# Patient Record
Sex: Male | Born: 1960
Health system: Southern US, Community
[De-identification: ages and names within clinical notes are randomized; demographics above are authoritative.]

## PROBLEM LIST (undated history)

## (undated) DIAGNOSIS — F32A Depression, unspecified: Secondary | ICD-10-CM

## (undated) DIAGNOSIS — I10 Essential (primary) hypertension: Secondary | ICD-10-CM

## (undated) DIAGNOSIS — E669 Obesity, unspecified: Secondary | ICD-10-CM

## (undated) DIAGNOSIS — Z8489 Family history of other specified conditions: Secondary | ICD-10-CM

## (undated) DIAGNOSIS — I2510 Atherosclerotic heart disease of native coronary artery without angina pectoris: Secondary | ICD-10-CM

## (undated) DIAGNOSIS — F329 Major depressive disorder, single episode, unspecified: Secondary | ICD-10-CM

## (undated) HISTORY — DX: Atherosclerotic heart disease of native coronary artery without angina pectoris: I25.10

## (undated) HISTORY — DX: Obesity, unspecified: E66.9

## (undated) HISTORY — DX: Essential (primary) hypertension: I10

## (undated) HISTORY — PX: NASAL SEPTUM SURGERY: SHX37

## (undated) HISTORY — PX: FRACTURE SURGERY: SHX138

---

## 1981-06-23 HISTORY — PX: INGUINAL HERNIA REPAIR: SUR1180

## 1995-06-24 HISTORY — PX: NASAL FRACTURE SURGERY: SHX718

## 2002-12-22 ENCOUNTER — Observation Stay (HOSPITAL_COMMUNITY): Admission: EM | Admit: 2002-12-22 | Discharge: 2002-12-23 | Payer: Self-pay | Admitting: Emergency Medicine

## 2002-12-22 ENCOUNTER — Encounter: Payer: Self-pay | Admitting: Emergency Medicine

## 2006-09-24 ENCOUNTER — Ambulatory Visit (HOSPITAL_COMMUNITY): Admission: RE | Admit: 2006-09-24 | Discharge: 2006-09-24 | Payer: Self-pay | Admitting: Family Medicine

## 2013-05-06 ENCOUNTER — Emergency Department (HOSPITAL_COMMUNITY): Payer: BC Managed Care – PPO

## 2013-05-06 ENCOUNTER — Emergency Department (HOSPITAL_COMMUNITY)
Admission: EM | Admit: 2013-05-06 | Discharge: 2013-05-06 | Disposition: A | Discharge status: Home / Self Care | Payer: BC Managed Care – PPO | Attending: Emergency Medicine | Admitting: Emergency Medicine

## 2013-05-06 ENCOUNTER — Encounter (HOSPITAL_COMMUNITY): Payer: Self-pay | Admitting: Emergency Medicine

## 2013-05-06 DIAGNOSIS — I10 Essential (primary) hypertension: Secondary | ICD-10-CM | POA: Insufficient documentation

## 2013-05-06 DIAGNOSIS — F430 Acute stress reaction: Secondary | ICD-10-CM

## 2013-05-06 DIAGNOSIS — Z79899 Other long term (current) drug therapy: Secondary | ICD-10-CM | POA: Insufficient documentation

## 2013-05-06 LAB — RAPID URINE DRUG SCREEN, HOSP PERFORMED
Amphetamines: NOT DETECTED
Barbiturates: NOT DETECTED
Benzodiazepines: NOT DETECTED
Cocaine: NOT DETECTED
Opiates: NOT DETECTED
Tetrahydrocannabinol: NOT DETECTED

## 2013-05-06 LAB — CBC
HCT: 44.3 % (ref 39.0–52.0)
Hemoglobin: 15.6 g/dL (ref 13.0–17.0)
MCH: 29.9 pg (ref 26.0–34.0)
MCHC: 35.2 g/dL (ref 30.0–36.0)
MCV: 84.9 fL (ref 78.0–100.0)
Platelets: 241 10*3/uL (ref 150–400)
RBC: 5.22 MIL/uL (ref 4.22–5.81)
RDW: 12.4 % (ref 11.5–15.5)
WBC: 7.5 10*3/uL (ref 4.0–10.5)

## 2013-05-06 LAB — BASIC METABOLIC PANEL
BUN: 23 mg/dL (ref 6–23)
CO2: 24 mEq/L (ref 19–32)
Calcium: 9.3 mg/dL (ref 8.4–10.5)
Chloride: 100 mEq/L (ref 96–112)
Creatinine, Ser: 1.23 mg/dL (ref 0.50–1.35)
GFR calc Af Amer: 76 mL/min — ABNORMAL LOW (ref >90)
GFR calc non Af Amer: 66 mL/min — ABNORMAL LOW (ref >90)
Glucose, Bld: 102 mg/dL — ABNORMAL HIGH (ref 70–99)
Potassium: 3.8 mEq/L (ref 3.5–5.1)
Sodium: 137 mEq/L (ref 135–145)

## 2013-05-06 LAB — ETHANOL: Alcohol, Ethyl (B): <11 mg/dL (ref 0–11)

## 2013-05-06 MED ORDER — LORAZEPAM 1 MG PO TABS
1.0000 mg | ORAL_TABLET | Freq: Once | ORAL | Status: AC
  Administered 2013-05-06: 1 mg via ORAL
  Filled 2013-05-06: qty 1

## 2013-05-06 MED ORDER — KETOROLAC TROMETHAMINE 15 MG/ML IJ SOLN
30.0000 mg | Freq: Once | INTRAMUSCULAR | Status: AC
  Administered 2013-05-06: 15 mg via INTRAVENOUS
  Filled 2013-05-06: qty 2

## 2013-05-06 NOTE — ED Notes (Signed)
Received pt from home via EMS with c/o arguing with wife at 0300. At 0400 pt became non verbal. Pt reported to EMS that he has insomnia x 2 days. Pt able to communicate by writing. Pt moves all extremities. Grips equal B/L, no facial droop noted, no arm drift.

## 2013-05-06 NOTE — ED Notes (Signed)
Patient transported to CT 

## 2013-05-06 NOTE — ED Notes (Signed)
Pt returned from CT scan, tolerated well.  Pt c/o generalized headache, denies any blurred vision - only being sleepy.  Pt is GCS 15, able to speak in full sentences, conversing and laughing with wife and family at bedside.

## 2013-05-06 NOTE — ED Provider Notes (Signed)
Medical screening examination/treatment/procedure(s) were performed by non-physician practitioner and as supervising physician I was immediately available for consultation/collaboration.  EKG Interpretation     Ventricular Rate:  74 PR Interval:  188 QRS Duration: 83 QT Interval:  376 QTC Calculation: 417 R Axis:   32 Text Interpretation:  Sinus rhythm No clear ischemic findings              Darlys Gales, MD 05/06/13 2212

## 2013-05-06 NOTE — ED Notes (Signed)
Peanut butter and crackers, Coke given.

## 2013-05-06 NOTE — ED Provider Notes (Signed)
CSN: [id number]     Arrival date & time 05/06/13  4098 History   First MD Initiated Contact with Patient 05/06/13 [id number]     Chief Complaint  Patient presents with  . Aphasia   (Consider location/radiation/quality/duration/timing/severity/associated sxs/prior Treatment) HPI Patient presents emergency department with inability to speak.  Patient, states, that he is under a lot of stress and that his brain is not working right.  Patient, states has not been sleeping well.  He been under a lot of stress, and that's what he feels is causing his problem.  Patient denies visual changes, numbness, weakness, dizziness,  blurred vision, fever, chest pain, shortness of breath, abdominal pain, nausea, vomiting, or diarrhea.  The patient, states, that this started several days, ago, but has gotten worse.  The patient, states, that he is stressed out. Past Medical History  Diagnosis Date  . Hypertension    Past Surgical History  Procedure Laterality Date  . Nose surgery     No family history on file. History  Substance Use Topics  . Smoking status: Never Smoker   . Smokeless tobacco: Not on file  . Alcohol Use: No    Review of Systems All other systems negative except as documented in the HPI. All pertinent positives and negatives as reviewed in the HPI. Allergies  Review of patient's allergies indicates no known allergies.  Home Medications   Current Outpatient Rx  Name  Route  Sig  Dispense  Refill  . amLODipine (NORVASC) 5 MG tablet   Oral   Take 5 mg by mouth daily.         Marland Kitchen losartan-hydrochlorothiazide (HYZAAR) 100-25 MG per tablet   Oral   Take 1 tablet by mouth daily.          BP 130/84  Pulse 64  Temp(Src) 98.2 F (36.8 C) (Oral)  Resp 16  Ht 6\' 3"  (1.905 m)  Wt 270 lb (122.471 kg)  BMI 33.75 kg/m2  SpO2 96% Physical Exam  Nursing note and vitals reviewed. Constitutional: He is oriented to person, place, and time. He appears well-developed and well-nourished. No  distress.  HENT:  Head: Normocephalic and atraumatic.  Mouth/Throat: Oropharynx is clear and moist.  Cardiovascular: Normal rate, regular rhythm and normal heart sounds.  Exam reveals no gallop and no friction rub.   No murmur heard. Pulmonary/Chest: Effort normal and breath sounds normal. No respiratory distress.  Neurological: He is alert and oriented to person, place, and time. He has normal strength. No cranial nerve deficit or sensory deficit. He exhibits normal muscle tone. Coordination normal. GCS eye subscore is 4. GCS verbal subscore is 5. GCS motor subscore is 6.  Patient is able to talk.  Skin: Skin is warm and dry.    ED Course  Procedures (including critical care time) Labs Review Labs Reviewed  BASIC METABOLIC PANEL - Abnormal; Notable for the following:    Glucose, Bld 102 (*)    GFR calc non Af Amer 66 (*)    GFR calc Af Amer 76 (*)    All other components within normal limits  CBC  URINE RAPID DRUG SCREEN (HOSP PERFORMED)  ETHANOL   Imaging Review Ct Head Wo Contrast  05/06/2013   CLINICAL DATA:  Headache  EXAM: CT HEAD WITHOUT CONTRAST  TECHNIQUE: Contiguous axial images were obtained from the base of the skull through the vertex without intravenous contrast.  COMPARISON:  None.  FINDINGS: No skull fracture is noted. There is right nasal septum deviation. No  intracranial hemorrhage, mass effect or midline shift. No acute cortical infarction. No mass lesion is noted on this unenhanced scan. The gray and white-matter differentiation is preserved.  IMPRESSION: No acute intracranial abnormality.   Electronically Signed   By: Natasha Mead M.D.   On: 05/06/2013 11:23    EKG Interpretation     Ventricular Rate:  74 PR Interval:  188 QRS Duration: 83 QT Interval:  376 QTC Calculation: 417 R Axis:   32 Text Interpretation:  Sinus rhythm No clear ischemic findings            Patient is initially writing down his responses to my questions.  I did however ask him  to please speak at least a few words to me about what is going on.  He said, "he is stressed out "patient seemed to get a little worse when his family was in room.  Patient is now fully speaking in complete sentences and doing well.  I do not feel this is a TIA, based on the fact that this is most likely a stress reaction.  She had no neurological deficits and was able to talk to me once.  I focused him and asked him to speak to me about what was going on     Carlyle Dolly, [place] 05/06/13 1152

## 2013-05-06 NOTE — ED Notes (Addendum)
Wife, Erie Noe: [phone]

## 2015-06-26 ENCOUNTER — Encounter (HOSPITAL_COMMUNITY): Payer: Self-pay | Admitting: Neurology

## 2015-06-26 ENCOUNTER — Emergency Department (HOSPITAL_COMMUNITY): Payer: BLUE CROSS/BLUE SHIELD

## 2015-06-26 ENCOUNTER — Observation Stay (HOSPITAL_COMMUNITY)
Admission: EM | Admit: 2015-06-26 | Discharge: 2015-06-28 | Disposition: A | Discharge status: Home / Self Care | Payer: BLUE CROSS/BLUE SHIELD | Attending: Internal Medicine | Admitting: Internal Medicine

## 2015-06-26 DIAGNOSIS — F329 Major depressive disorder, single episode, unspecified: Secondary | ICD-10-CM | POA: Insufficient documentation

## 2015-06-26 DIAGNOSIS — Z79899 Other long term (current) drug therapy: Secondary | ICD-10-CM | POA: Diagnosis not present

## 2015-06-26 DIAGNOSIS — R0602 Shortness of breath: Secondary | ICD-10-CM | POA: Diagnosis not present

## 2015-06-26 DIAGNOSIS — R0789 Other chest pain: Secondary | ICD-10-CM | POA: Diagnosis present

## 2015-06-26 DIAGNOSIS — Z6834 Body mass index (BMI) 34.0-34.9, adult: Secondary | ICD-10-CM | POA: Diagnosis not present

## 2015-06-26 DIAGNOSIS — I2510 Atherosclerotic heart disease of native coronary artery without angina pectoris: Secondary | ICD-10-CM | POA: Insufficient documentation

## 2015-06-26 DIAGNOSIS — I10 Essential (primary) hypertension: Secondary | ICD-10-CM | POA: Diagnosis not present

## 2015-06-26 DIAGNOSIS — E669 Obesity, unspecified: Secondary | ICD-10-CM | POA: Diagnosis not present

## 2015-06-26 DIAGNOSIS — Z8249 Family history of ischemic heart disease and other diseases of the circulatory system: Secondary | ICD-10-CM | POA: Insufficient documentation

## 2015-06-26 DIAGNOSIS — R079 Chest pain, unspecified: Secondary | ICD-10-CM | POA: Diagnosis present

## 2015-06-26 HISTORY — DX: Depression, unspecified: F32.A

## 2015-06-26 HISTORY — DX: Major depressive disorder, single episode, unspecified: F32.9

## 2015-06-26 HISTORY — DX: Family history of other specified conditions: Z84.89

## 2015-06-26 LAB — BASIC METABOLIC PANEL
ANION GAP: 10 (ref 5–15)
BUN: 23 mg/dL — ABNORMAL HIGH (ref 6–20)
CALCIUM: 9.6 mg/dL (ref 8.9–10.3)
CO2: 24 mmol/L (ref 22–32)
Chloride: 104 mmol/L (ref 101–111)
Creatinine, Ser: 1.05 mg/dL (ref 0.61–1.24)
GFR calc Af Amer: >60 mL/min (ref >60)
Glucose, Bld: 110 mg/dL — ABNORMAL HIGH (ref 65–99)
POTASSIUM: 4.3 mmol/L (ref 3.5–5.1)
SODIUM: 138 mmol/L (ref 135–145)

## 2015-06-26 LAB — CBC
HCT: 49.8 % (ref 39.0–52.0)
HEMOGLOBIN: 17.6 g/dL — AB (ref 13.0–17.0)
MCH: 30.6 pg (ref 26.0–34.0)
MCHC: 35.3 g/dL (ref 30.0–36.0)
MCV: 86.5 fL (ref 78.0–100.0)
Platelets: 244 10*3/uL (ref 150–400)
RBC: 5.76 MIL/uL (ref 4.22–5.81)
RDW: 12.4 % (ref 11.5–15.5)
WBC: 9 10*3/uL (ref 4.0–10.5)

## 2015-06-26 LAB — I-STAT TROPONIN, ED
TROPONIN I, POC: 0.02 ng/mL (ref 0.00–0.08)
TROPONIN I, POC: 0.01 ng/mL (ref 0.00–0.08)

## 2015-06-26 LAB — D-DIMER, QUANTITATIVE: D-Dimer, Quant: 0.34 ug/mL-FEU (ref 0.00–0.50)

## 2015-06-26 LAB — BRAIN NATRIURETIC PEPTIDE: B Natriuretic Peptide: 6.5 pg/mL (ref 0.0–100.0)

## 2015-06-26 MED ORDER — AMLODIPINE BESYLATE 5 MG PO TABS
5.0000 mg | ORAL_TABLET | Freq: Every day | ORAL | Status: DC
  Administered 2015-06-27 – 2015-06-28 (×2): 5 mg via ORAL
  Filled 2015-06-26 (×2): qty 1

## 2015-06-26 MED ORDER — ENOXAPARIN SODIUM 40 MG/0.4ML ~~LOC~~ SOLN
40.0000 mg | Freq: Every day | SUBCUTANEOUS | Status: DC
  Administered 2015-06-27: 40 mg via SUBCUTANEOUS
  Filled 2015-06-26 (×2): qty 0.4

## 2015-06-26 MED ORDER — ESCITALOPRAM OXALATE 20 MG PO TABS
20.0000 mg | ORAL_TABLET | Freq: Every day | ORAL | Status: DC
  Administered 2015-06-27 – 2015-06-28 (×2): 20 mg via ORAL
  Filled 2015-06-26 (×2): qty 1

## 2015-06-26 MED ORDER — NITROGLYCERIN 0.4 MG SL SUBL: 0.4000 mg | SUBLINGUAL_TABLET | SUBLINGUAL | Status: DC | PRN

## 2015-06-26 MED ORDER — ACETAMINOPHEN 325 MG PO TABS
650.0000 mg | ORAL_TABLET | ORAL | Status: DC | PRN
  Filled 2015-06-26: qty 2

## 2015-06-26 MED ORDER — LOSARTAN POTASSIUM-HCTZ 100-25 MG PO TABS: 1.0000 | ORAL_TABLET | Freq: Every day | ORAL | Status: DC

## 2015-06-26 MED ORDER — HYDROCHLOROTHIAZIDE 25 MG PO TABS
25.0000 mg | ORAL_TABLET | Freq: Every day | ORAL | Status: DC
  Administered 2015-06-27 – 2015-06-28 (×2): 25 mg via ORAL
  Filled 2015-06-26 (×2): qty 1

## 2015-06-26 MED ORDER — ASPIRIN EC 325 MG PO TBEC
325.0000 mg | DELAYED_RELEASE_TABLET | Freq: Every day | ORAL | Status: DC
  Administered 2015-06-28: 325 mg via ORAL
  Filled 2015-06-26: qty 1

## 2015-06-26 MED ORDER — ONDANSETRON HCL 4 MG/2ML IJ SOLN: 4.0000 mg | Freq: Four times a day (QID) | INTRAMUSCULAR | Status: DC | PRN

## 2015-06-26 MED ORDER — LOSARTAN POTASSIUM 50 MG PO TABS
100.0000 mg | ORAL_TABLET | Freq: Every day | ORAL | Status: DC
  Administered 2015-06-27 – 2015-06-28 (×2): 100 mg via ORAL
  Filled 2015-06-26 (×2): qty 2

## 2015-06-26 MED ORDER — MORPHINE SULFATE (PF) 2 MG/ML IV SOLN: 2.0000 mg | INTRAVENOUS | Status: DC | PRN

## 2015-06-26 NOTE — ED Notes (Signed)
MD at bedside. 

## 2015-06-26 NOTE — H&P (Signed)
[facility] History and Physical  Nicholas Primuserry Torbert NWG:[id number] DOB: 02-11-1961 DOA: 06/26/2015  Referring physician: Dr. Katrinka BlazingSmith. PCP: Ailene RavelHAMRICK,MAURA L, MD  Specialists: None.  Chief Complaint: Chest pain.  HPI: Nicholas Mcclure is a 55 y.o. male history of hypertension and depression has been experiencing chest pressure with shortness of breath on exertion over the last 2-3 weeks. Discomfort is only on exertion. Denies any productive cough fever chills. Chest discomfort resolves on sitting. Presently chest pain-free. EKG shows nonspecific ST changes and given the patient's exertional symptoms and family history patient has been admitted for further workup for chest pain. Denies any nausea vomiting abdominal pain did have some diaphoresis.   Review of Systems: As presented in the history of presenting illness, rest negative.  Past Medical History  Diagnosis Date  . Hypertension   . Depression    Past Surgical History  Procedure Laterality Date  . Nose surgery    . Hernia repair     Social History:  reports that he has never smoked. He does not have any smokeless tobacco history on file. He reports that he does not drink alcohol or use illicit drugs. Where does patient live home. Can patient participate in ADLs? Yes.  No Known Allergies  Family History:  Family History  Problem Relation Age of Onset  . CAD Father       Prior to Admission medications   Medication Sig Start Date End Date Taking? Authorizing Provider  amLODipine (NORVASC) 5 MG tablet Take 5 mg by mouth daily.   Yes Historical Provider, MD  ANDROGEL PUMP 20.25 MG/ACT (1.62%) GEL APPLY 4 PUMPS TO UPPER ARM OR SHOULDER DAILY IN THE MORNING 06/09/15  Yes Historical Provider, MD  Aspirin-Salicylamide-Caffeine (BC HEADACHE POWDER PO) Take 1 packet by mouth daily as needed (general pain / headache).   Yes Historical Provider, MD  escitalopram (LEXAPRO) 20 MG tablet Take 20 mg by mouth daily. 06/05/15  Yes Historical  Provider, MD  losartan-hydrochlorothiazide (HYZAAR) 100-25 MG per tablet Take 1 tablet by mouth daily.   Yes Historical Provider, MD    Physical Exam: Filed Vitals:   06/26/15 2045 06/26/15 2100 06/26/15 2115 06/26/15 2145  BP: 109/75 128/88 111/71 125/75  Pulse: 71 83 73 91  Temp:    97.9 F (36.6 C)  TempSrc:    Oral  Resp: 13 19 16 17   Height:    6\' 4"  (1.93 m)  Weight:    128.277 kg (282 lb 12.8 oz)  SpO2: 95% 94% 92% 93%     General:  Moderately built and nourished.  Eyes: Anicteric no pallor.  ENT: No discharge from the ears eyes nose and mouth.  Neck: No mass felt. No JVD appreciated.  Cardiovascular: S1 and S2 heard.  Respiratory: No rhonchi or crepitations.  Abdomen: Soft nontender bowel sounds present. No guarding or rigidity.  Skin: No rash.  Musculoskeletal: No edema.  Psychiatric: Appears normal.  Neurologic: Alert awake oriented to time place and person. Moves all extremities.  Labs on Admission:  Basic Metabolic Panel:  Recent Labs Lab 06/26/15 1250  NA 138  K 4.3  CL 104  CO2 24  GLUCOSE 110*  BUN 23*  CREATININE 1.05  CALCIUM 9.6   Liver Function Tests: No results for input(s): AST, ALT, ALKPHOS, BILITOT, PROT, ALBUMIN in the last 168 hours. No results for input(s): LIPASE, AMYLASE in the last 168 hours. No results for input(s): AMMONIA in the last 168 hours. CBC:  Recent Labs Lab 06/26/15 1250  WBC  9.0  HGB 17.6*  HCT 49.8  MCV 86.5  PLT 244   Cardiac Enzymes: No results for input(s): CKTOTAL, CKMB, CKMBINDEX, TROPONINI in the last 168 hours.  BNP (last 3 results)  Recent Labs  06/26/15 1834  BNP 6.5    ProBNP (last 3 results) No results for input(s): PROBNP in the last 8760 hours.  CBG: No results for input(s): GLUCAP in the last 168 hours.  Radiological Exams on Admission: Dg Chest 2 View  06/26/2015  CLINICAL DATA:  Shortness of breath for 1 months, worsening the past 3 days. Central chest pain. EXAM: CHEST   2 VIEW COMPARISON:  None. FINDINGS: The heart size and mediastinal contours are within normal limits. Both lungs are clear. The visualized skeletal structures are unremarkable. IMPRESSION: No active cardiopulmonary disease. Electronically Signed   By: Charlett Nose M.D.   On: 06/26/2015 13:25    EKG: Independently reviewed. Normal sinus rhythm with nonspecific ST-T changes.  Assessment/Plan Principal Problem:   Chest pain Active Problems:   Essential hypertension   1. Chest pain - symptoms are mostly exertional concerning for angina. At this time patient is chest pain-free. We'll cycle cardiac markers check 2-D echo keep patient nothing by mouth after 4 AM in anticipation of possible cardiac procedure. D-dimer was negative. 2. Hypertension mildly uncontrolled on admission - presently improved. Closely monitor. Continue home medications including ARB hydrochlorothiazide and Norvasc. 3. History of depression - continue home medications.   DVT Prophylaxis Lovenox.  Code Status: Full code.  Family Communication: Discussed with patient.  Disposition Plan: Admit for observation.    Ammaar Encina N. [facility] Pager [phone].  If 7PM-7AM, please contact night-coverage [email or web address] Password [email or web address] 06/26/2015, 10:41 PM
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
## 2015-06-26 NOTE — ED Notes (Signed)
Transported in stable condition , denies pain / respirations unlabored.

## 2015-06-26 NOTE — ED Notes (Signed)
Pt reports intermittent sob for 1 month and intermittent central cp for several days. Has had nausea. Is a x 4. Denies cardiac hx.

## 2015-06-27 ENCOUNTER — Observation Stay (HOSPITAL_BASED_OUTPATIENT_CLINIC_OR_DEPARTMENT_OTHER): Payer: BLUE CROSS/BLUE SHIELD

## 2015-06-27 ENCOUNTER — Encounter (HOSPITAL_COMMUNITY)
Admission: EM | Disposition: A | Discharge status: Home / Self Care | Payer: Self-pay | Source: Home / Self Care | Attending: Internal Medicine

## 2015-06-27 DIAGNOSIS — I10 Essential (primary) hypertension: Secondary | ICD-10-CM

## 2015-06-27 DIAGNOSIS — R079 Chest pain, unspecified: Secondary | ICD-10-CM

## 2015-06-27 DIAGNOSIS — R0609 Other forms of dyspnea: Secondary | ICD-10-CM | POA: Diagnosis not present

## 2015-06-27 DIAGNOSIS — E669 Obesity, unspecified: Secondary | ICD-10-CM | POA: Diagnosis not present

## 2015-06-27 DIAGNOSIS — I200 Unstable angina: Secondary | ICD-10-CM | POA: Diagnosis not present

## 2015-06-27 DIAGNOSIS — R0602 Shortness of breath: Secondary | ICD-10-CM | POA: Insufficient documentation

## 2015-06-27 DIAGNOSIS — I214 Non-ST elevation (NSTEMI) myocardial infarction: Secondary | ICD-10-CM

## 2015-06-27 HISTORY — PX: CARDIAC CATHETERIZATION: SHX172

## 2015-06-27 LAB — TROPONIN I
TROPONIN I: 0.03 ng/mL (ref <0.031)
Troponin I: <0.03 ng/mL (ref <0.031)

## 2015-06-27 LAB — RAPID URINE DRUG SCREEN, HOSP PERFORMED
Amphetamines: NOT DETECTED
BARBITURATES: NOT DETECTED
Benzodiazepines: NOT DETECTED
Cocaine: NOT DETECTED
Opiates: NOT DETECTED
Tetrahydrocannabinol: NOT DETECTED

## 2015-06-27 LAB — PROTIME-INR
INR: 1.06 (ref 0.00–1.49)
Prothrombin Time: 14 seconds (ref 11.6–15.2)

## 2015-06-27 SURGERY — LEFT HEART CATH AND CORONARY ANGIOGRAPHY

## 2015-06-27 MED ORDER — IOHEXOL 350 MG/ML SOLN
INTRAVENOUS | Status: DC | PRN
  Administered 2015-06-27: 115 mL via INTRA_ARTERIAL

## 2015-06-27 MED ORDER — SODIUM CHLORIDE 0.9 % IV SOLN: 250.0000 mL | INTRAVENOUS | Status: DC | PRN

## 2015-06-27 MED ORDER — LIDOCAINE HCL (PF) 1 % IJ SOLN
INTRAMUSCULAR | Status: DC | PRN
  Administered 2015-06-27: 3 mL via SUBCUTANEOUS

## 2015-06-27 MED ORDER — HEPARIN (PORCINE) IN NACL 2-0.9 UNIT/ML-% IJ SOLN
INTRAMUSCULAR | Status: AC
  Filled 2015-06-27: qty 1000

## 2015-06-27 MED ORDER — FENTANYL CITRATE (PF) 100 MCG/2ML IJ SOLN
INTRAMUSCULAR | Status: DC | PRN
  Administered 2015-06-27: 50 ug via INTRAVENOUS

## 2015-06-27 MED ORDER — VERAPAMIL HCL 2.5 MG/ML IV SOLN
INTRAVENOUS | Status: AC
  Filled 2015-06-27: qty 2

## 2015-06-27 MED ORDER — MIDAZOLAM HCL 2 MG/2ML IJ SOLN
INTRAMUSCULAR | Status: DC | PRN
  Administered 2015-06-27: 1 mg via INTRAVENOUS

## 2015-06-27 MED ORDER — VERAPAMIL HCL 2.5 MG/ML IV SOLN
INTRAVENOUS | Status: DC | PRN
  Administered 2015-06-27: 10 mL via INTRA_ARTERIAL

## 2015-06-27 MED ORDER — SODIUM CHLORIDE 0.9 % IJ SOLN: 3.0000 mL | INTRAMUSCULAR | Status: DC | PRN

## 2015-06-27 MED ORDER — LIDOCAINE HCL (PF) 1 % IJ SOLN
INTRAMUSCULAR | Status: AC
  Filled 2015-06-27: qty 30

## 2015-06-27 MED ORDER — HEPARIN (PORCINE) IN NACL 2-0.9 UNIT/ML-% IJ SOLN
INTRAMUSCULAR | Status: DC | PRN
  Administered 2015-06-27: 16:00:00

## 2015-06-27 MED ORDER — HEPARIN SODIUM (PORCINE) 1000 UNIT/ML IJ SOLN
INTRAMUSCULAR | Status: DC | PRN
  Administered 2015-06-27: 6000 [IU] via INTRAVENOUS

## 2015-06-27 MED ORDER — HEPARIN SODIUM (PORCINE) 1000 UNIT/ML IJ SOLN
INTRAMUSCULAR | Status: AC
  Filled 2015-06-27: qty 1

## 2015-06-27 MED ORDER — ASPIRIN 81 MG PO CHEW
81.0000 mg | CHEWABLE_TABLET | ORAL | Status: AC
  Administered 2015-06-27: 81 mg via ORAL

## 2015-06-27 MED ORDER — FENTANYL CITRATE (PF) 100 MCG/2ML IJ SOLN
INTRAMUSCULAR | Status: AC
  Filled 2015-06-27: qty 2

## 2015-06-27 MED ORDER — MIDAZOLAM HCL 2 MG/2ML IJ SOLN
INTRAMUSCULAR | Status: AC
  Filled 2015-06-27: qty 2

## 2015-06-27 MED ORDER — SODIUM CHLORIDE 0.9 % IJ SOLN: 3.0000 mL | Freq: Two times a day (BID) | INTRAMUSCULAR | Status: DC

## 2015-06-27 MED ORDER — SODIUM CHLORIDE 0.9 % WEIGHT BASED INFUSION: 1.0000 mL/kg/h | INTRAVENOUS | Status: DC

## 2015-06-27 MED ORDER — SODIUM CHLORIDE 0.9 % WEIGHT BASED INFUSION: 3.0000 mL/kg/h | INTRAVENOUS | Status: DC

## 2015-06-27 MED ORDER — ASPIRIN 81 MG PO CHEW
81.0000 mg | CHEWABLE_TABLET | ORAL | Status: DC
Start: 2015-06-28 — End: 2015-06-27
  Filled 2015-06-27: qty 1

## 2015-06-27 SURGICAL SUPPLY — 12 items
CATH INFINITI 5 FR JL3.5 (CATHETERS) ×2 IMPLANT
CATH INFINITI 5FR JL4 (CATHETERS) ×1 IMPLANT
CATH INFINITI JR4 5F (CATHETERS) ×2 IMPLANT
CATH LAUNCHER 5F RADR (CATHETERS) IMPLANT
CATHETER LAUNCHER 5F RADR (CATHETERS) ×2
DEVICE RAD COMP TR BAND LRG (VASCULAR PRODUCTS) ×2 IMPLANT
GLIDESHEATH SLEND A-KIT 6F 22G (SHEATH) ×2 IMPLANT
KIT HEART LEFT (KITS) ×2 IMPLANT
PACK CARDIAC CATHETERIZATION (CUSTOM PROCEDURE TRAY) ×2 IMPLANT
TRANSDUCER W/STOPCOCK (MISCELLANEOUS) ×2 IMPLANT
TUBING CIL FLEX 10 FLL-RA (TUBING) ×2 IMPLANT
WIRE SAFE-T 1.5MM-J .035X260CM (WIRE) ×2 IMPLANT

## 2015-06-27 NOTE — Interval H&P Note (Signed)
Cath Lab Visit (complete for each Cath Lab visit)  Clinical Evaluation Leading to the Procedure:   ACS: No.  Non-ACS:    Anginal Classification: CCS III  Anti-ischemic medical therapy: Maximal Therapy (2 or more classes of medications)  Non-Invasive Test Results: No non-invasive testing performed  Prior CABG: No previous CABG      History and Physical Interval Note:  06/27/2015 4:29 PM  Nicholas Mcclure  has presented today for surgery, with the diagnosis of angina  The various methods of treatment have been discussed with the patient and family. After consideration of risks, benefits and other options for treatment, the patient has consented to  Procedure(s): Left Heart Cath and Coronary Angiography (N/A) as a surgical intervention .  The patient's history has been reviewed, patient examined, no change in status, stable for surgery.  I have reviewed the patient's chart and labs.  Questions were answered to the patient's satisfaction.     Lesleigh NoeSMITH III,Nicholas Mcclure
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
## 2015-06-27 NOTE — Progress Notes (Signed)
Pt if fluids ordered for 06/28/15.  Notified B. Sharol HarnessSimmons, PA earlier that pt's IVF is for tomorrow and if ok to do NS at 10/hr.  She instructed that was ok to for cath.  Will continue to monitor.  Mak Bonny,RN.

## 2015-06-27 NOTE — Progress Notes (Signed)
Echocardiogram 2D Echocardiogram has been performed.  Nicholas Mcclure, [name] M 06/27/2015, 10:12 AM

## 2015-06-27 NOTE — Progress Notes (Signed)
  PROGRESS NOTE  Nicholas Mcclure ZOX:[id number] DOB: 12-14-1960 DOA: 06/26/2015 PCP: Ailene RavelHAMRICK,MAURA L, MD  Assessment/Plan: Exertional chest pain-- CE pending last one-2 negative so far -echo done and pending read -cardiology consult -no h/o smoking, no h/o asthma -pain and SOB affecting his ability to work per patient -NPO  HTN -continue home meds  H/o depression -home meds  Code Status: full Family Communication: patient and family at bedside Disposition Plan:    Consultants:  cards  Procedures:      HPI/Subjective: SOB and pain with exertion  Objective: Filed Vitals:   06/26/15 2145 06/27/15 0415  BP: 125/75 127/79  Pulse: 91 51  Temp: 97.9 F (36.6 C) 97.9 F (36.6 C)  Resp: 17 20    Intake/Output Summary (Last 24 hours) at 06/27/15 1012 Last data filed at 06/27/15 0419  Gross per 24 hour  Intake    480 ml  Output    550 ml  Net    -70 ml   Filed Weights   06/26/15 2145 06/27/15 0413  Weight: 128.277 kg (282 lb 12.8 oz) 126.236 kg (278 lb 4.8 oz)    Exam:   General:  Awake, NAD  Cardiovascular: rrr  Respiratory: clear, no wheezing  Abdomen: +BS, soft  Musculoskeletal: no edema  Data Reviewed: Basic Metabolic Panel:  Recent Labs Lab 06/26/15 1250  NA 138  K 4.3  CL 104  CO2 24  GLUCOSE 110*  BUN 23*  CREATININE 1.05  CALCIUM 9.6   Liver Function Tests: No results for input(s): AST, ALT, ALKPHOS, BILITOT, PROT, ALBUMIN in the last 168 hours. No results for input(s): LIPASE, AMYLASE in the last 168 hours. No results for input(s): AMMONIA in the last 168 hours. CBC:  Recent Labs Lab 06/26/15 1250  WBC 9.0  HGB 17.6*  HCT 49.8  MCV 86.5  PLT 244   Cardiac Enzymes:  Recent Labs Lab 06/26/15 2323 06/27/15 0425  TROPONINI <0.03 0.03   BNP (last 3 results)  Recent Labs  06/26/15 1834  BNP 6.5    ProBNP (last 3 results) No results for input(s): PROBNP in the last 8760 hours.  CBG: No results for input(s):  GLUCAP in the last 168 hours.  No results found for this or any previous visit (from the past 240 hour(s)).   Studies: Dg Chest 2 View  06/26/2015  CLINICAL DATA:  Shortness of breath for 1 months, worsening the past 3 days. Central chest pain. EXAM: CHEST  2 VIEW COMPARISON:  None. FINDINGS: The heart size and mediastinal contours are within normal limits. Both lungs are clear. The visualized skeletal structures are unremarkable. IMPRESSION: No active cardiopulmonary disease. Electronically Signed   By: Charlett NoseKevin  Dover M.D.   On: 06/26/2015 13:25    Scheduled Meds: . amLODipine  5 mg Oral Daily  . aspirin EC  325 mg Oral Daily  . enoxaparin (LOVENOX) injection  40 mg Subcutaneous QHS  . escitalopram  20 mg Oral Daily  . losartan  100 mg Oral Daily   And  . hydrochlorothiazide  25 mg Oral Daily   Continuous Infusions:  Antibiotics Given (last 72 hours)    None      Principal Problem:   Chest pain Active Problems:   Essential hypertension    Time spent: 25 min    Jheremy Boger [facility]  [facility] Pager [phone]. If 7PM-7AM, please contact night-coverage at [email or web address], password [email or web address] 06/27/2015, 10:12 AM  LOS: 1 day

## 2015-06-28 ENCOUNTER — Encounter (HOSPITAL_COMMUNITY): Payer: Self-pay | Admitting: Interventional Cardiology

## 2015-06-28 DIAGNOSIS — I10 Essential (primary) hypertension: Secondary | ICD-10-CM | POA: Diagnosis not present

## 2015-06-28 DIAGNOSIS — R0602 Shortness of breath: Secondary | ICD-10-CM

## 2015-06-28 DIAGNOSIS — R079 Chest pain, unspecified: Secondary | ICD-10-CM | POA: Diagnosis not present

## 2015-06-28 NOTE — Progress Notes (Signed)
   SUBJECTIVE:  No prblems with his wrist.  Breathing feels better.  OBJECTIVE:   Vitals:   Filed Vitals:   06/27/15 1815 06/27/15 1830 06/27/15 2030 06/28/15 0538  BP: 133/94 137/97 130/79 136/79  Pulse:   71 65  Temp:   98 F (36.7 C) 98 F (36.7 C)  TempSrc:   Oral Oral  Resp:   20 18  Height:      Weight:    275 lb 3.2 oz (124.83 kg)  SpO2:   93% 95%   I&O's:   Intake/Output Summary (Last 24 hours) at 06/28/15 1320 Last data filed at 06/28/15 0900  Gross per 24 hour  Intake    480 ml  Output      0 ml  Net    480 ml        PHYSICAL EXAM General: Well developed, well nourished, in no acute distress Head:   Normal cephalic and atramatic  Lungs:   Clear bilaterally to auscultation. Heart:   HRRR S1 S2  No JVD.   Abdomen: abdomen soft and non-tender Msk:  Back normal,  Normal strength and tone for age. Extremities:   No edema.  3+ right radial pulse, no hematoma Neuro: Alert and oriented. Psych:  Normal affect, responds appropriately Skin: No rash   LABS: Basic Metabolic Panel:  Recent Labs  16/03/9600/03/17 1250  NA 138  K 4.3  CL 104  CO2 24  GLUCOSE 110*  BUN 23*  CREATININE 1.05  CALCIUM 9.6   Liver Function Tests: No results for input(s): AST, ALT, ALKPHOS, BILITOT, PROT, ALBUMIN in the last 72 hours. No results for input(s): LIPASE, AMYLASE in the last 72 hours. CBC:  Recent Labs  06/26/15 1250  WBC 9.0  HGB 17.6*  HCT 49.8  MCV 86.5  PLT 244   Cardiac Enzymes:  Recent Labs  06/26/15 2323 06/27/15 0425 06/27/15 1035  TROPONINI <0.03 0.03 <0.03   BNP: Invalid input(s): POCBNP D-Dimer:  Recent Labs  06/26/15 1940  DDIMER 0.34   Hemoglobin A1C: No results for input(s): HGBA1C in the last 72 hours. Fasting Lipid Panel: No results for input(s): CHOL, HDL, LDLCALC, TRIG, CHOLHDL, LDLDIRECT in the last 72 hours. Thyroid Function Tests: No results for input(s): TSH, T4TOTAL, T3FREE, THYROIDAB in the last 72 hours.  Invalid  input(s): FREET3 Anemia Panel: No results for input(s): VITAMINB12, FOLATE, FERRITIN, TIBC, IRON, RETICCTPCT in the last 72 hours. Coag Panel:   Lab Results  Component Value Date   INR 1.06 06/27/2015    RADIOLOGY: Dg Chest 2 View  06/26/2015  CLINICAL DATA:  Shortness of breath for 1 months, worsening the past 3 days. Central chest pain. EXAM: CHEST  2 VIEW COMPARISON:  None. FINDINGS: The heart size and mediastinal contours are within normal limits. Both lungs are clear. The visualized skeletal structures are unremarkable. IMPRESSION: No active cardiopulmonary disease. Electronically Signed   By: Charlett NoseKevin  Dover M.D.   On: 06/26/2015 13:25      ASSESSMENT: SHOB  PLAN:  Negative cath.  Radial site intact.  Pulmonary w/u being arranged.  Corky CraftsJayadeep S Pollyann Roa, MD  06/28/2015  1:20 PM

## 2015-06-28 NOTE — Discharge Summary (Signed)
Physician Discharge Summary  Nicholas Primuserry Theys JYN:[id number] DOB: 02-Mar-1961 DOA: 06/26/2015  PCP: Ailene RavelHAMRICK,MAURA L, MD  Admit date: 06/26/2015 Discharge date: 06/28/2015  Time spent: 35 minutes  Recommendations for Outpatient Follow-up:  1. Outpatient pulm follow up- ?OSA study and PFTs   Discharge Diagnoses:  Principal Problem:   Chest pain Active Problems:   Essential hypertension   Chest pressure   SOB (shortness of breath)   Discharge Condition: improved  Diet recommendation: cardiac  Filed Weights   06/26/15 2145 06/27/15 0413 06/28/15 0538  Weight: 128.277 kg (282 lb 12.8 oz) 126.236 kg (278 lb 4.8 oz) 124.83 kg (275 lb 3.2 oz)    History of present illness:  Nicholas Mcclure is a 55 y.o. male history of hypertension and depression has been experiencing chest pressure with shortness of breath on exertion over the last 2-3 weeks. Discomfort is only on exertion. Denies any productive cough fever chills. Chest discomfort resolves on sitting. Presently chest pain-free. EKG shows nonspecific ST changes and given the patient's exertional symptoms and family history patient has been admitted for further workup for chest pain. Denies any nausea vomiting abdominal pain did have some diaphoresis.   Hospital Course:   Chest pain/SOB - symptoms are mostly exertional concerning for angina. At this time patient is chest pain-free.  -ddimer negative -echo ok Cath: Widely patent coronary arteries with ectasia noted in the LAD and ramus intermedius. 30% proximal RCA. Low normal LV systolic function with normal EDP -? Anxiety -outpatient pulm follow up- PCP to arrange in Echo  Hypertension mildly uncontrolled on admission - presently improved. Closely monitor. Continue home medications including ARB hydrochlorothiazide and Norvasc.  History of depression - continue home medications  Procedures:  Cath  Echo: Study Conclusions  - Left ventricle: The cavity size was normal. Wall  thickness was increased in a pattern of mild LVH. Systolic function was normal. The estimated ejection fraction was in the range of 55% to 60%. Wall motion was normal; there were no regional wall motion abnormalities. Doppler parameters are consistent with abnormal left ventricular relaxation (grade 1 diastolic dysfunction).   Consultations:  cards  Discharge Exam: Filed Vitals:   06/27/15 2030 06/28/15 0538  BP: 130/79 136/79  Pulse: 71 65  Temp: 98 F (36.7 C) 98 F (36.7 C)  Resp: 20 18    General: awake, NAD Cardiovascular: rrr Respiratory: clear  Discharge Instructions   Discharge Instructions    Diet - low sodium heart healthy    Complete by:  As directed      Discharge instructions    Complete by:  As directed   Outpatient pulm follow up     Increase activity slowly    Complete by:  As directed           Current Discharge Medication List    CONTINUE these medications which have NOT CHANGED   Details  amLODipine (NORVASC) 5 MG tablet Take 5 mg by mouth daily.    ANDROGEL PUMP 20.25 MG/ACT (1.62%) GEL APPLY 4 PUMPS TO UPPER ARM OR SHOULDER DAILY IN THE MORNING Refills: 2    Aspirin-Salicylamide-Caffeine (BC HEADACHE POWDER PO) Take 1 packet by mouth daily as needed (general pain / headache).    escitalopram (LEXAPRO) 20 MG tablet Take 20 mg by mouth daily. Refills: 4    losartan-hydrochlorothiazide (HYZAAR) 100-25 MG per tablet Take 1 tablet by mouth daily.       No Known Allergies Follow-up Information    [facility]  Specialty:  Family Medicine   Why:  for referral to outpatient pulm   Contact information:   [address] [place] [address] [phone]       Follow up with Lesleigh Noe, MD. Go on 07/03/2015.   Specialty:  Cardiology   Why:  @3 ;00pm with Danya Dunn   Contact information:   [address] [place] [address] [phone]        The results of  significant diagnostics from this hospitalization (including imaging, microbiology, ancillary and laboratory) are listed below for reference.    Significant Diagnostic Studies: Dg Chest 2 View  06/26/2015  CLINICAL DATA:  Shortness of breath for 1 months, worsening the past 3 days. Central chest pain. EXAM: CHEST  2 VIEW COMPARISON:  None. FINDINGS: The heart size and mediastinal contours are within normal limits. Both lungs are clear. The visualized skeletal structures are unremarkable. IMPRESSION: No active cardiopulmonary disease. Electronically Signed   By: Charlett Nose M.D.   On: 06/26/2015 13:25    Microbiology: No results found for this or any previous visit (from the past 240 hour(s)).   Labs: Basic Metabolic Panel:  Recent Labs Lab 06/26/15 1250  NA 138  K 4.3  CL 104  CO2 24  GLUCOSE 110*  BUN 23*  CREATININE 1.05  CALCIUM 9.6   Liver Function Tests: No results for input(s): AST, ALT, ALKPHOS, BILITOT, PROT, ALBUMIN in the last 168 hours. No results for input(s): LIPASE, AMYLASE in the last 168 hours. No results for input(s): AMMONIA in the last 168 hours. CBC:  Recent Labs Lab 06/26/15 1250  WBC 9.0  HGB 17.6*  HCT 49.8  MCV 86.5  PLT 244   Cardiac Enzymes:  Recent Labs Lab 06/26/15 2323 06/27/15 0425 06/27/15 1035  TROPONINI <0.03 0.03 <0.03   BNP: BNP (last 3 results)  Recent Labs  06/26/15 1834  BNP 6.5    ProBNP (last 3 results) No results for input(s): PROBNP in the last 8760 hours.  CBG: No results for input(s): GLUCAP in the last 168 hours.     Signed:  Jameila Keeny Juanetta Gosling DO  [facility] 06/28/2015, 1:05 PM

## 2015-06-28 NOTE — Progress Notes (Signed)
Patient  Was given discharge instructions  .

## 2015-06-29 MED FILL — Heparin Sodium (Porcine) 2 Unit/ML in Sodium Chloride 0.9%: INTRAMUSCULAR | Qty: 500 | Status: AC

## 2015-07-03 ENCOUNTER — Encounter: Payer: BLUE CROSS/BLUE SHIELD | Admitting: Physician Assistant

## 2015-07-03 ENCOUNTER — Encounter: Payer: Self-pay | Admitting: Physician Assistant

## 2015-07-03 NOTE — Progress Notes (Signed)
This encounter was created in error - please disregard.

## 2015-07-03 NOTE — Progress Notes (Deleted)
No show

## 2015-07-04 ENCOUNTER — Encounter: Payer: Self-pay | Admitting: Physician Assistant

## 2015-07-09 ENCOUNTER — Institutional Professional Consult (permissible substitution): Payer: BLUE CROSS/BLUE SHIELD | Admitting: Internal Medicine

## 2015-08-30 IMAGING — CT CT HEAD W/O CM
2 series · 16 of 30 positions shown, 20 images · non-contrast
Comparison: None.

CLINICAL DATA: Headache

EXAM:
CT HEAD WITHOUT CONTRAST
TECHNIQUE: Contiguous axial images were obtained from the base of the skull
through the vertex without intravenous contrast.

[Series 2: head w/o · axial · non-contrast · 0.39mm/px · z∈[+41,+176]mm · 13 of 33 slices shown, 17 images]
[im 3/33  brain]
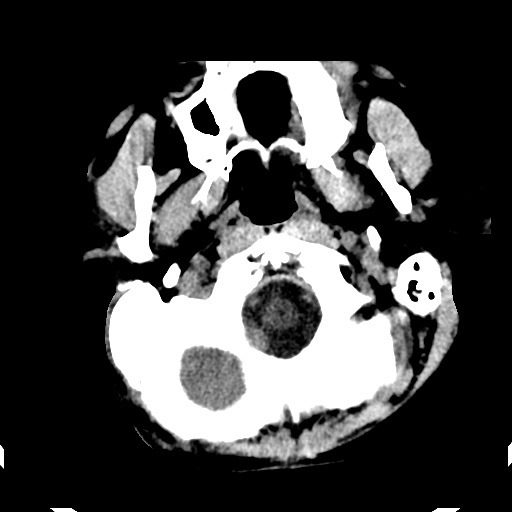
[im 3/33  bone]
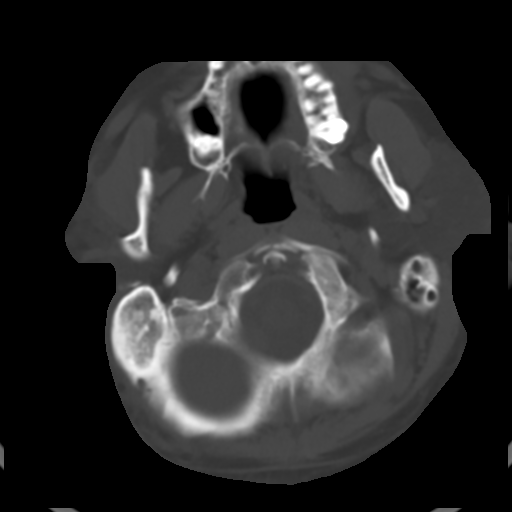
[im 5/33  brain]
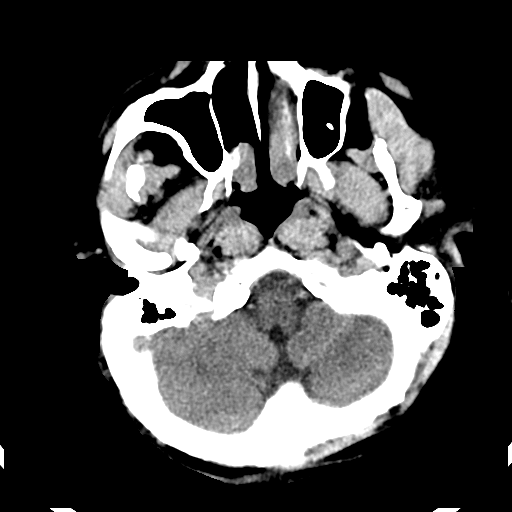
[im 7/33  brain]
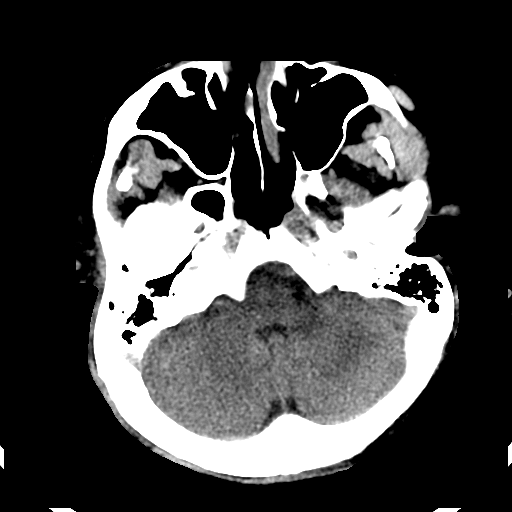
[im 10/33  brain]
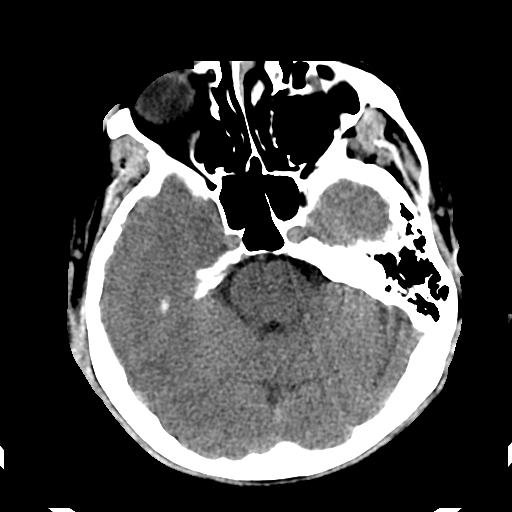
[im 12/33  brain]
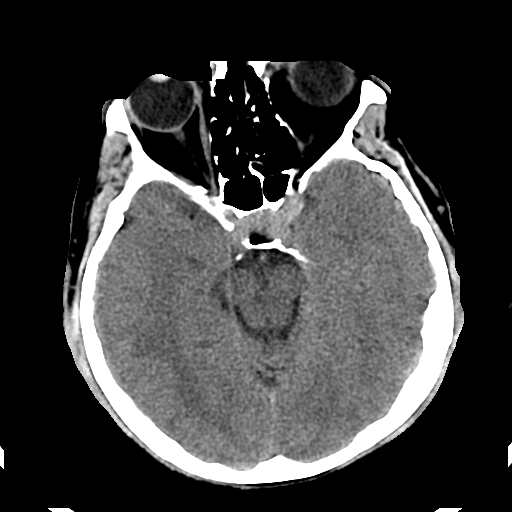
[im 12/33  bone]
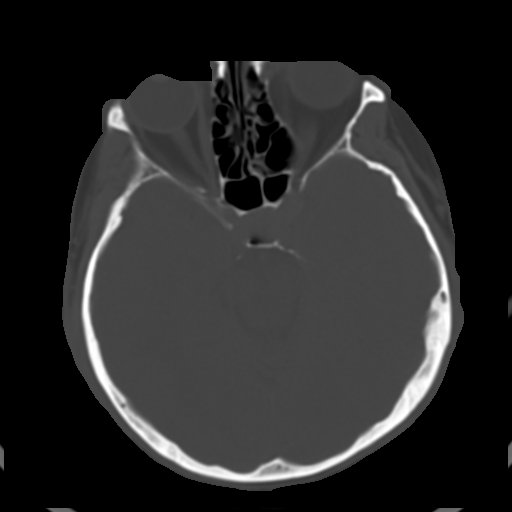
[im 14/33  brain]
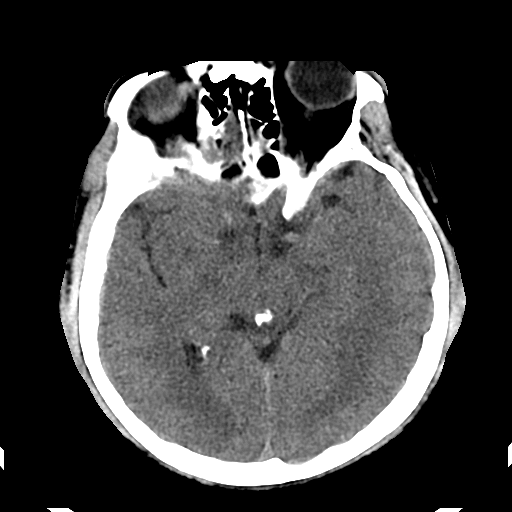
[im 17/33  brain]
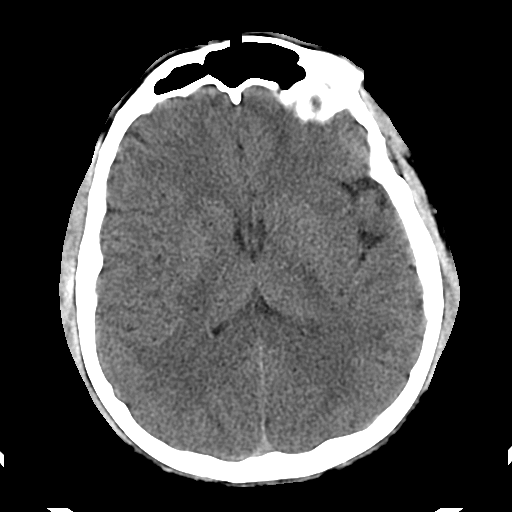
[im 19/33  brain]
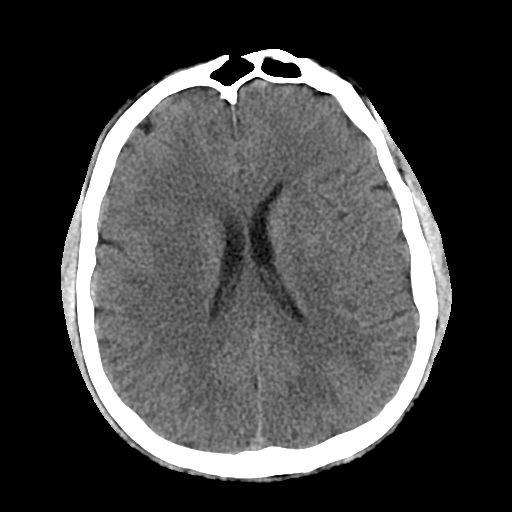
[im 21/33  brain]
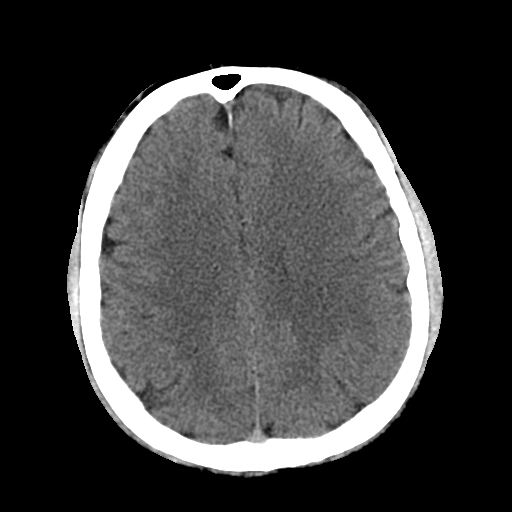
[im 21/33  bone]
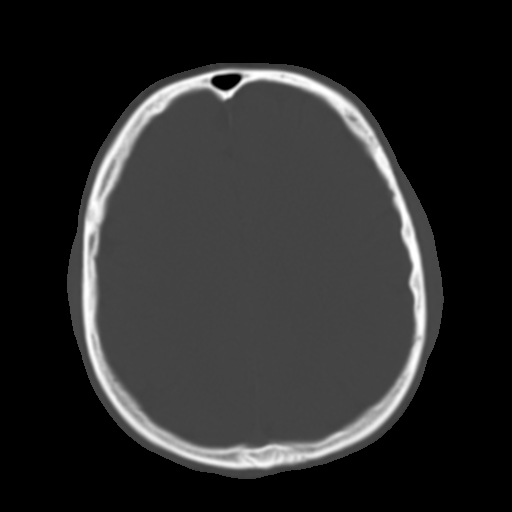
[im 23/33  brain]
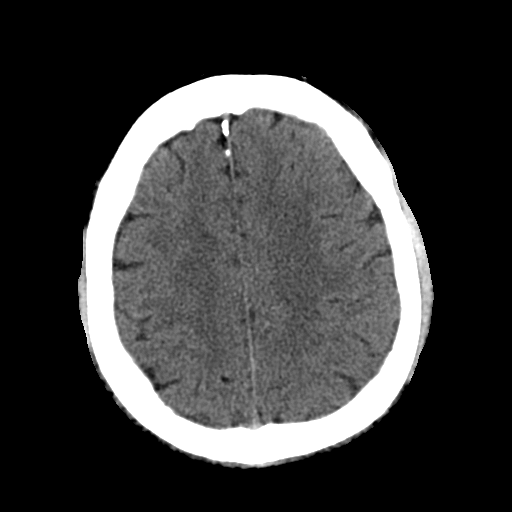
[im 26/33  brain]
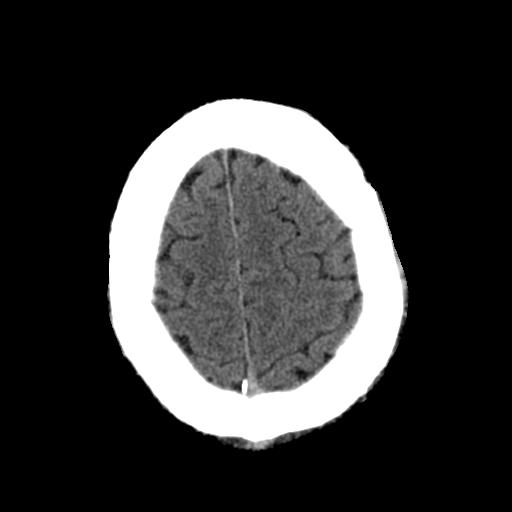
[im 28/33  brain]
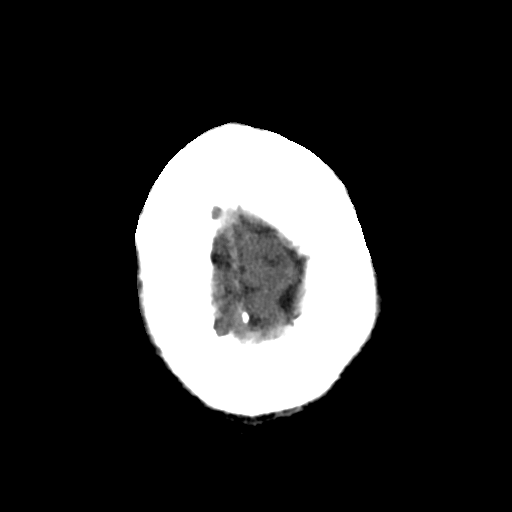
[im 30/33  brain]
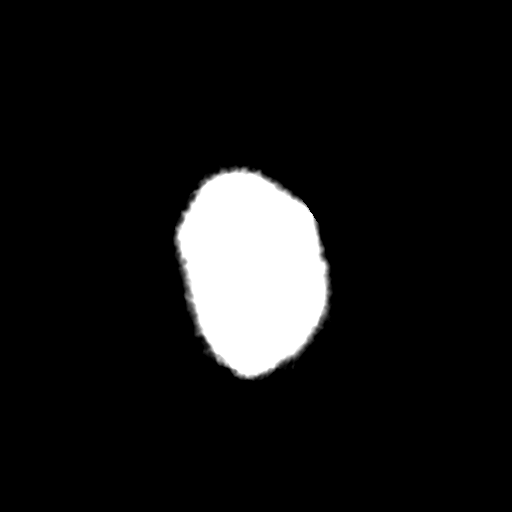
[im 30/33  bone]
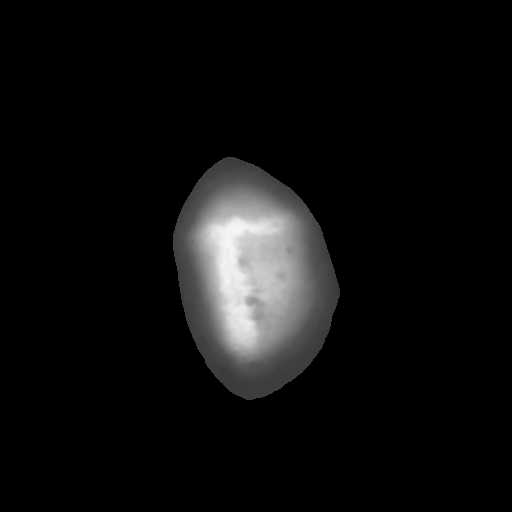

[Series 3: head w/o bone · axial · non-contrast · 0.39mm/px · z∈[+41,+86]mm · 3 of 33 slices shown]
[im 3/33  bone]
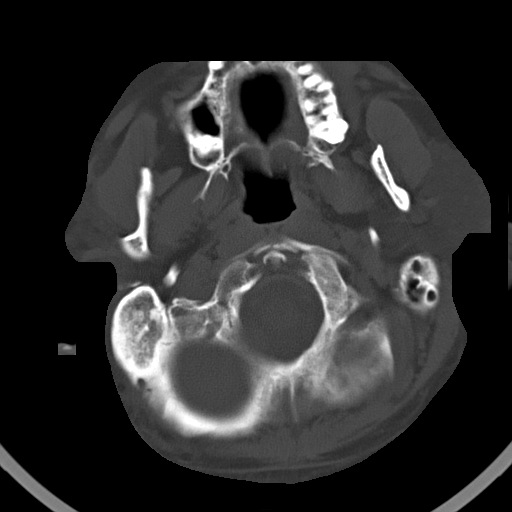
[im 7/33  bone]
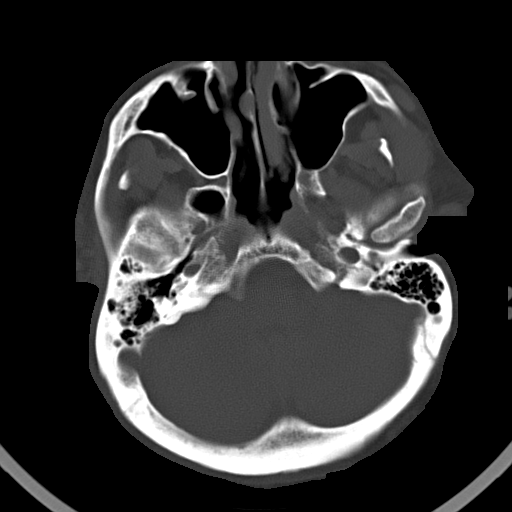
[im 12/33  bone]
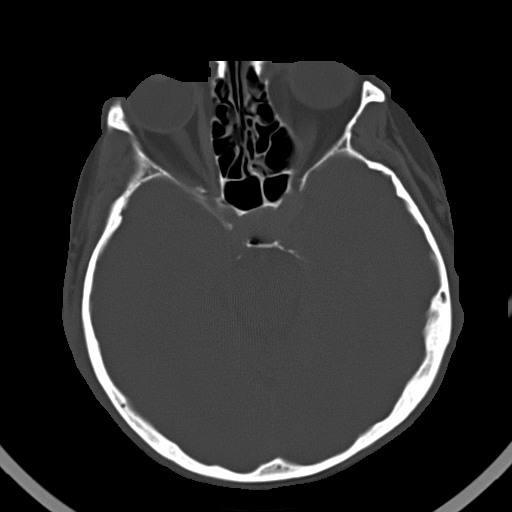

[16 of 30 positions shown; findings below may reference images not displayed]

FINDINGS: No skull fracture is noted. There is right nasal septum deviation.
No intracranial hemorrhage, mass effect or midline shift. No acute
cortical infarction. No mass lesion is noted on this unenhanced
scan. The gray and white-matter differentiation is preserved.
IMPRESSION: No acute intracranial abnormality.

## 2016-04-21 DIAGNOSIS — Z6834 Body mass index (BMI) 34.0-34.9, adult: Secondary | ICD-10-CM | POA: Diagnosis not present

## 2016-04-21 DIAGNOSIS — Z1389 Encounter for screening for other disorder: Secondary | ICD-10-CM | POA: Diagnosis not present

## 2016-04-21 DIAGNOSIS — I10 Essential (primary) hypertension: Secondary | ICD-10-CM | POA: Diagnosis not present

## 2017-02-24 DIAGNOSIS — I10 Essential (primary) hypertension: Secondary | ICD-10-CM | POA: Diagnosis not present

## 2017-02-24 DIAGNOSIS — E349 Endocrine disorder, unspecified: Secondary | ICD-10-CM | POA: Diagnosis not present

## 2017-02-24 DIAGNOSIS — F329 Major depressive disorder, single episode, unspecified: Secondary | ICD-10-CM | POA: Diagnosis not present

## 2017-02-24 DIAGNOSIS — Z125 Encounter for screening for malignant neoplasm of prostate: Secondary | ICD-10-CM | POA: Diagnosis not present

## 2017-08-28 DIAGNOSIS — R5383 Other fatigue: Secondary | ICD-10-CM | POA: Diagnosis not present

## 2017-08-28 DIAGNOSIS — Z1331 Encounter for screening for depression: Secondary | ICD-10-CM | POA: Diagnosis not present

## 2017-08-28 DIAGNOSIS — E349 Endocrine disorder, unspecified: Secondary | ICD-10-CM | POA: Diagnosis not present

## 2017-08-28 DIAGNOSIS — F329 Major depressive disorder, single episode, unspecified: Secondary | ICD-10-CM | POA: Diagnosis not present

## 2017-08-28 DIAGNOSIS — Z79899 Other long term (current) drug therapy: Secondary | ICD-10-CM | POA: Diagnosis not present

## 2017-08-28 DIAGNOSIS — I10 Essential (primary) hypertension: Secondary | ICD-10-CM | POA: Diagnosis not present

## 2017-10-19 IMAGING — DX DG CHEST 2V
2 series · 2 of 2 positions shown · non-contrast
Comparison: None.

CLINICAL DATA: Shortness of breath for 1 months, worsening the past
3 days. Central chest pain.

EXAM:
CHEST  2 VIEW

[w chest pa]
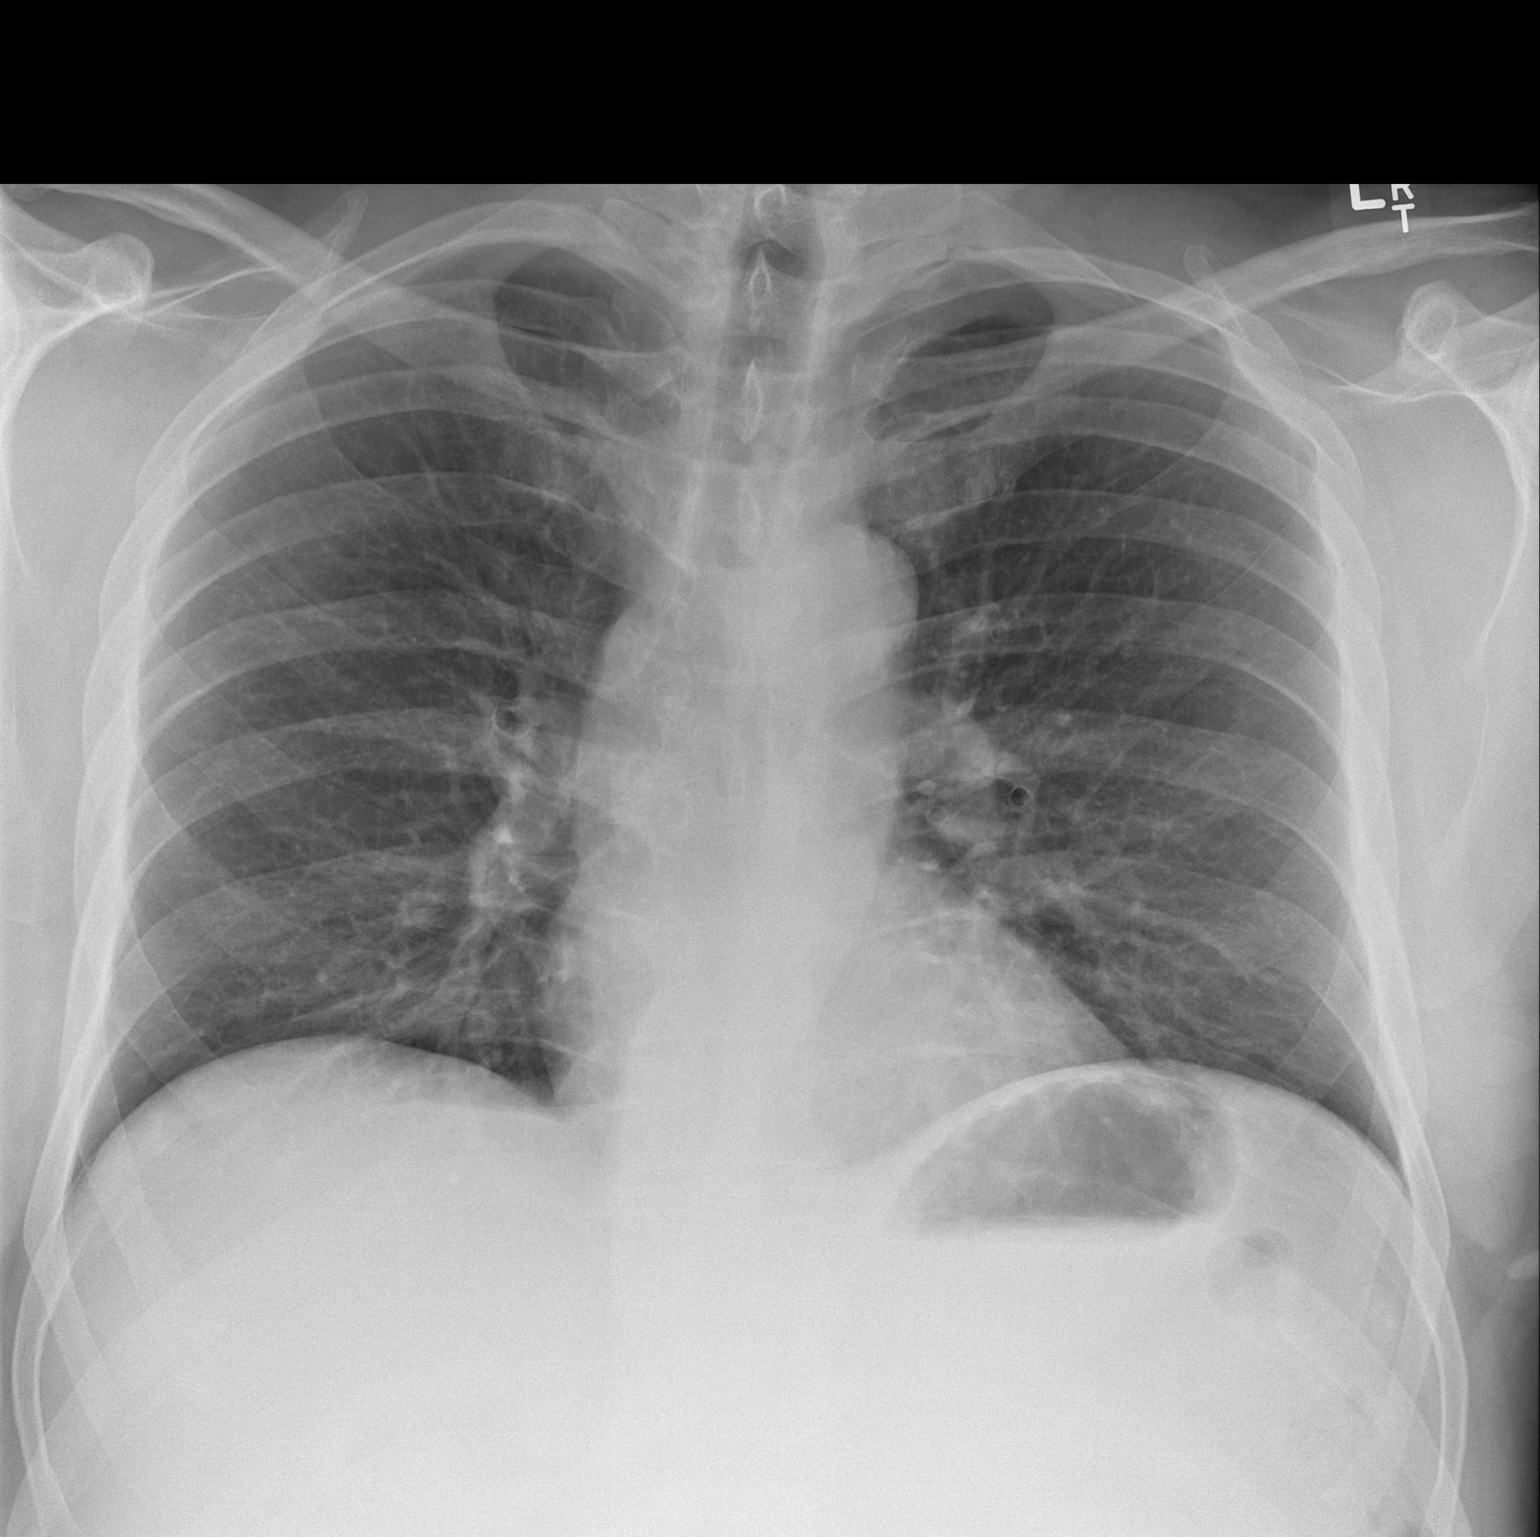

[w chest lat]
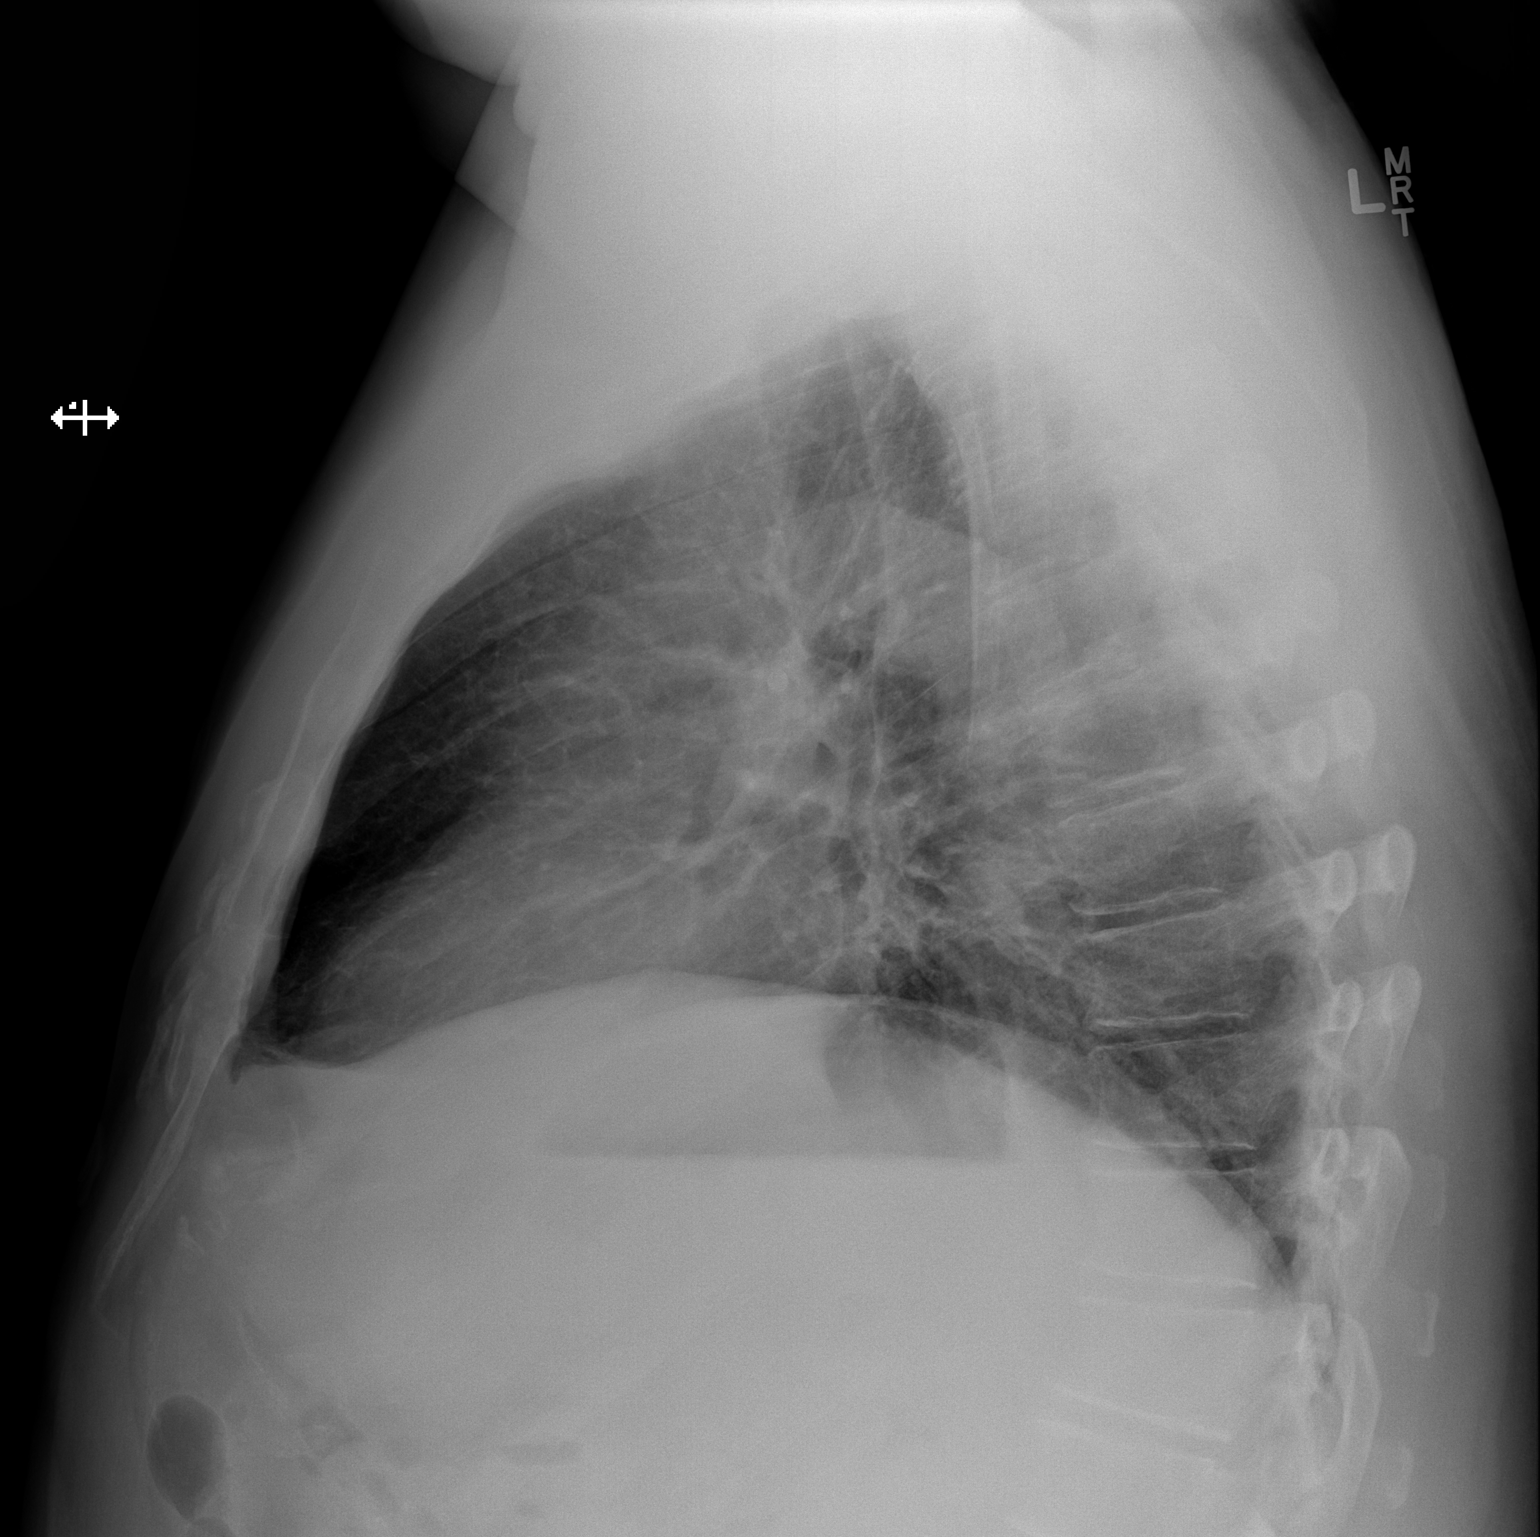

[2 of 2 positions shown; findings below may reference images not displayed]

FINDINGS: The heart size and mediastinal contours are within normal limits.
Both lungs are clear. The visualized skeletal structures are
unremarkable.
IMPRESSION: No active cardiopulmonary disease.

## 2017-11-27 DIAGNOSIS — K635 Polyp of colon: Secondary | ICD-10-CM | POA: Diagnosis not present

## 2017-11-27 DIAGNOSIS — Z1211 Encounter for screening for malignant neoplasm of colon: Secondary | ICD-10-CM | POA: Diagnosis not present

## 2017-11-27 DIAGNOSIS — D125 Benign neoplasm of sigmoid colon: Secondary | ICD-10-CM | POA: Diagnosis not present

## 2018-04-13 DIAGNOSIS — I10 Essential (primary) hypertension: Secondary | ICD-10-CM | POA: Diagnosis not present

## 2018-04-13 DIAGNOSIS — E349 Endocrine disorder, unspecified: Secondary | ICD-10-CM | POA: Diagnosis not present

## 2018-04-13 DIAGNOSIS — F329 Major depressive disorder, single episode, unspecified: Secondary | ICD-10-CM | POA: Diagnosis not present

## 2018-04-13 DIAGNOSIS — E7800 Pure hypercholesterolemia, unspecified: Secondary | ICD-10-CM | POA: Diagnosis not present

## 2018-04-13 DIAGNOSIS — Z0000 Encounter for general adult medical examination without abnormal findings: Secondary | ICD-10-CM | POA: Diagnosis not present

## 2018-08-03 DIAGNOSIS — E349 Endocrine disorder, unspecified: Secondary | ICD-10-CM | POA: Diagnosis not present

## 2018-08-03 DIAGNOSIS — Z6835 Body mass index (BMI) 35.0-35.9, adult: Secondary | ICD-10-CM | POA: Diagnosis not present

## 2018-08-03 DIAGNOSIS — N529 Male erectile dysfunction, unspecified: Secondary | ICD-10-CM | POA: Diagnosis not present

## 2018-09-27 DIAGNOSIS — I10 Essential (primary) hypertension: Secondary | ICD-10-CM | POA: Diagnosis not present

## 2018-09-27 DIAGNOSIS — N529 Male erectile dysfunction, unspecified: Secondary | ICD-10-CM | POA: Diagnosis not present

## 2018-09-27 DIAGNOSIS — E349 Endocrine disorder, unspecified: Secondary | ICD-10-CM | POA: Diagnosis not present

## 2018-09-27 DIAGNOSIS — Z20828 Contact with and (suspected) exposure to other viral communicable diseases: Secondary | ICD-10-CM | POA: Diagnosis not present

## 2018-12-15 DIAGNOSIS — R51 Headache: Secondary | ICD-10-CM | POA: Diagnosis not present

## 2018-12-17 DIAGNOSIS — Z87891 Personal history of nicotine dependence: Secondary | ICD-10-CM | POA: Diagnosis not present

## 2018-12-17 DIAGNOSIS — E349 Endocrine disorder, unspecified: Secondary | ICD-10-CM | POA: Diagnosis not present

## 2018-12-17 DIAGNOSIS — Z1331 Encounter for screening for depression: Secondary | ICD-10-CM | POA: Diagnosis not present

## 2018-12-17 DIAGNOSIS — I10 Essential (primary) hypertension: Secondary | ICD-10-CM | POA: Diagnosis not present

## 2018-12-17 DIAGNOSIS — F329 Major depressive disorder, single episode, unspecified: Secondary | ICD-10-CM | POA: Diagnosis not present

## 2018-12-17 DIAGNOSIS — E782 Mixed hyperlipidemia: Secondary | ICD-10-CM | POA: Diagnosis not present

## 2020-01-12 DIAGNOSIS — I10 Essential (primary) hypertension: Secondary | ICD-10-CM | POA: Diagnosis not present

## 2020-01-12 DIAGNOSIS — E782 Mixed hyperlipidemia: Secondary | ICD-10-CM | POA: Diagnosis not present

## 2020-02-01 DIAGNOSIS — E785 Hyperlipidemia, unspecified: Secondary | ICD-10-CM | POA: Diagnosis not present

## 2020-02-01 DIAGNOSIS — Z79899 Other long term (current) drug therapy: Secondary | ICD-10-CM | POA: Diagnosis not present

## 2020-02-01 DIAGNOSIS — I10 Essential (primary) hypertension: Secondary | ICD-10-CM | POA: Diagnosis not present

## 2020-02-01 DIAGNOSIS — Z20822 Contact with and (suspected) exposure to covid-19: Secondary | ICD-10-CM | POA: Diagnosis not present

## 2020-02-01 DIAGNOSIS — Z888 Allergy status to other drugs, medicaments and biological substances status: Secondary | ICD-10-CM | POA: Diagnosis not present

## 2020-02-04 DIAGNOSIS — E785 Hyperlipidemia, unspecified: Secondary | ICD-10-CM | POA: Diagnosis not present

## 2020-02-04 DIAGNOSIS — R05 Cough: Secondary | ICD-10-CM | POA: Diagnosis not present

## 2020-02-04 DIAGNOSIS — U071 COVID-19: Secondary | ICD-10-CM | POA: Diagnosis not present

## 2020-02-04 DIAGNOSIS — I10 Essential (primary) hypertension: Secondary | ICD-10-CM | POA: Diagnosis not present
